# Patient Record
Sex: Female | Born: 1990 | Race: White | Hispanic: No | Marital: Married
Health system: Southern US, Community
[De-identification: ages and names within clinical notes are randomized; demographics above are authoritative.]

---

## 2010-12-19 ENCOUNTER — Other Ambulatory Visit (HOSPITAL_COMMUNITY): Payer: Self-pay | Admitting: Obstetrics and Gynecology

## 2010-12-19 DIAGNOSIS — Z3682 Encounter for antenatal screening for nuchal translucency: Secondary | ICD-10-CM

## 2010-12-20 ENCOUNTER — Ambulatory Visit (HOSPITAL_COMMUNITY): Payer: Self-pay

## 2010-12-20 ENCOUNTER — Other Ambulatory Visit (HOSPITAL_COMMUNITY): Payer: Self-pay

## 2010-12-21 ENCOUNTER — Ambulatory Visit (HOSPITAL_COMMUNITY)
Admission: RE | Admit: 2010-12-21 | Discharge: 2010-12-21 | Disposition: A | Discharge status: Home / Self Care | Payer: BC Managed Care – PPO | Source: Ambulatory Visit | Attending: Obstetrics and Gynecology | Admitting: Obstetrics and Gynecology

## 2010-12-21 DIAGNOSIS — O351XX0 Maternal care for (suspected) chromosomal abnormality in fetus, not applicable or unspecified: Secondary | ICD-10-CM | POA: Insufficient documentation

## 2010-12-21 DIAGNOSIS — Z3689 Encounter for other specified antenatal screening: Secondary | ICD-10-CM | POA: Insufficient documentation

## 2010-12-21 DIAGNOSIS — O3510X0 Maternal care for (suspected) chromosomal abnormality in fetus, unspecified, not applicable or unspecified: Secondary | ICD-10-CM | POA: Insufficient documentation

## 2010-12-21 DIAGNOSIS — Z3682 Encounter for antenatal screening for nuchal translucency: Secondary | ICD-10-CM

## 2010-12-27 ENCOUNTER — Inpatient Hospital Stay (HOSPITAL_COMMUNITY)
Admission: AD | Admit: 2010-12-27 | Discharge: 2010-12-27 | Disposition: A | Discharge status: Home / Self Care | Payer: BC Managed Care – PPO | Source: Ambulatory Visit | Attending: Obstetrics and Gynecology | Admitting: Obstetrics and Gynecology

## 2010-12-27 DIAGNOSIS — O2650 Maternal hypotension syndrome, unspecified trimester: Secondary | ICD-10-CM | POA: Insufficient documentation

## 2010-12-27 LAB — COMPREHENSIVE METABOLIC PANEL
ALT: 11 U/L (ref 0–35)
Alkaline Phosphatase: 52 U/L (ref 39–117)
BUN: 5 mg/dL — ABNORMAL LOW (ref 6–23)
CO2: 20 mEq/L (ref 19–32)
Chloride: 100 mEq/L (ref 96–112)
Glucose, Bld: 82 mg/dL (ref 70–99)
Potassium: 3.4 mEq/L — ABNORMAL LOW (ref 3.5–5.1)
Sodium: 128 mEq/L — ABNORMAL LOW (ref 135–145)
Total Bilirubin: 0.2 mg/dL — ABNORMAL LOW (ref 0.3–1.2)

## 2010-12-27 LAB — URINALYSIS, ROUTINE W REFLEX MICROSCOPIC
Bilirubin Urine: NEGATIVE
Hgb urine dipstick: NEGATIVE
Ketones, ur: NEGATIVE mg/dL
Protein, ur: NEGATIVE mg/dL
Urobilinogen, UA: 0.2 mg/dL (ref 0.0–1.0)

## 2010-12-27 LAB — CBC
HCT: 32.1 % — ABNORMAL LOW (ref 36.0–46.0)
Hemoglobin: 11 g/dL — ABNORMAL LOW (ref 12.0–15.0)
MCV: 79.1 fL (ref 78.0–100.0)
RBC: 4.06 MIL/uL (ref 3.87–5.11)
WBC: 9.6 10*3/uL (ref 4.0–10.5)

## 2010-12-27 LAB — URINE MICROSCOPIC-ADD ON

## 2010-12-28 LAB — GLUCOSE, CAPILLARY: Glucose-Capillary: 85 mg/dL (ref 70–99)

## 2011-12-16 IMAGING — US US OB NUCHAL TRANSLUCENCY 1ST GEST
1 series · 14 of 28 positions shown · non-contrast
Comparison: none

[Series 1: us ob nuchal translucency 1st gest · 0.21mm/px · 14 of 40 slices shown]
[im 2/40]
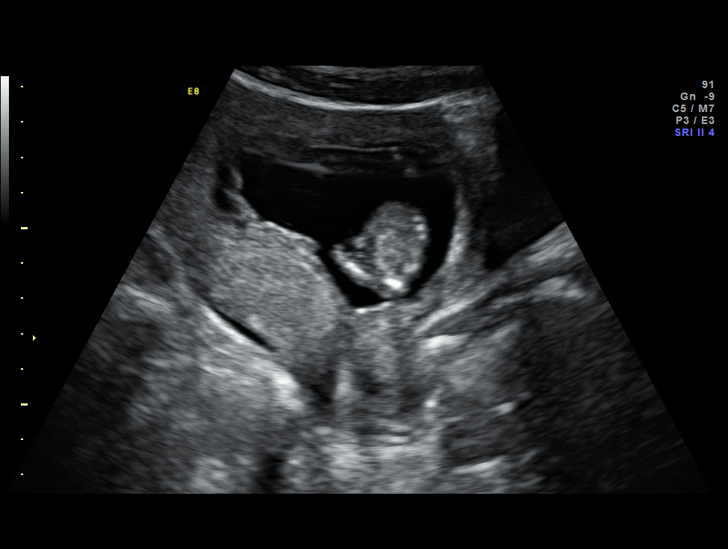
[im 5/40]
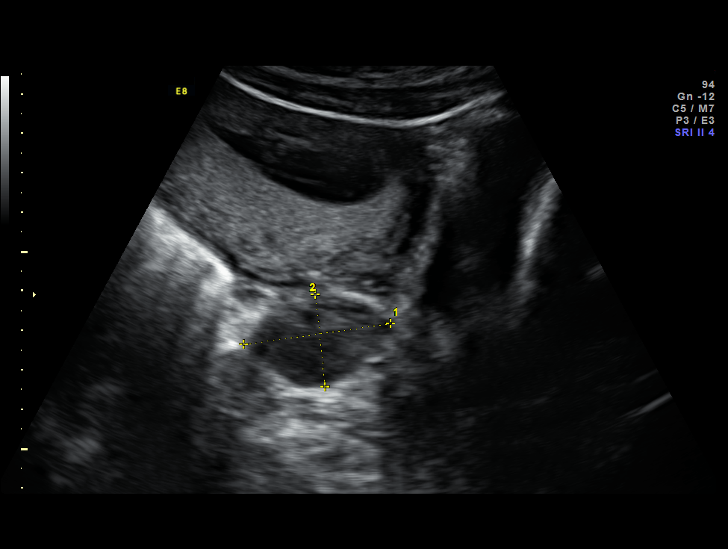
[im 8/40]
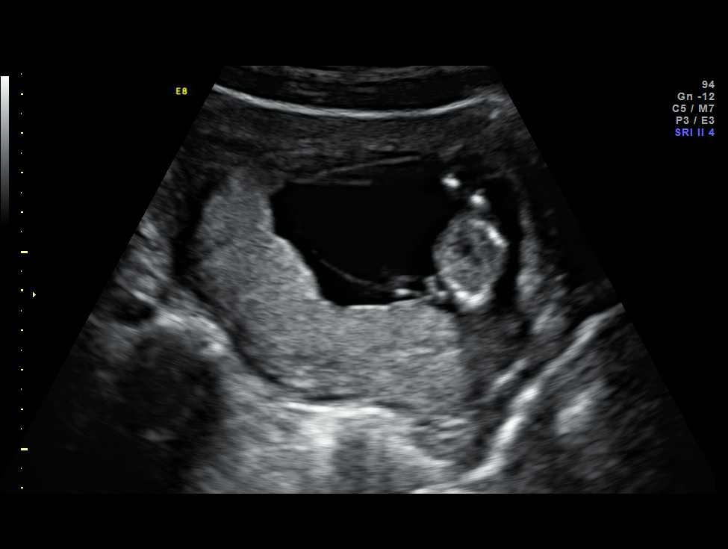
[im 11/40]
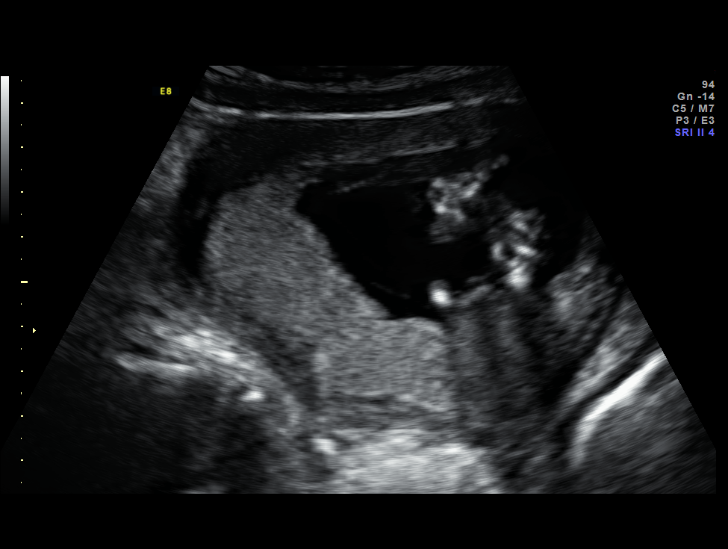
[im 14/40]
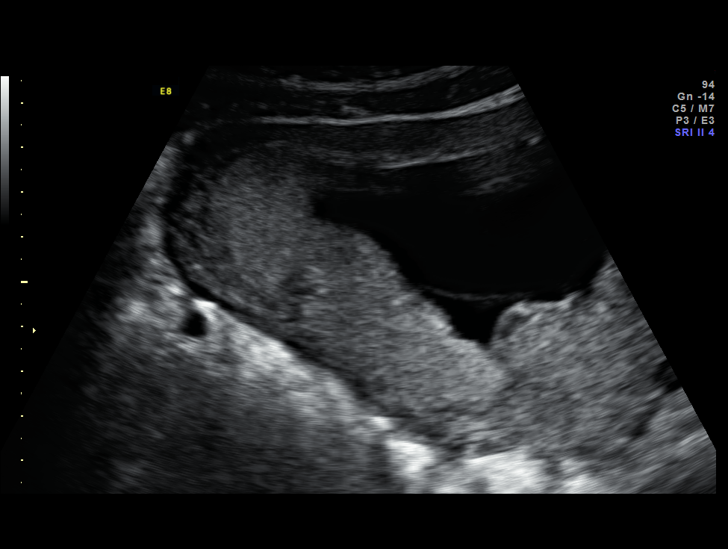
[im 16/40]
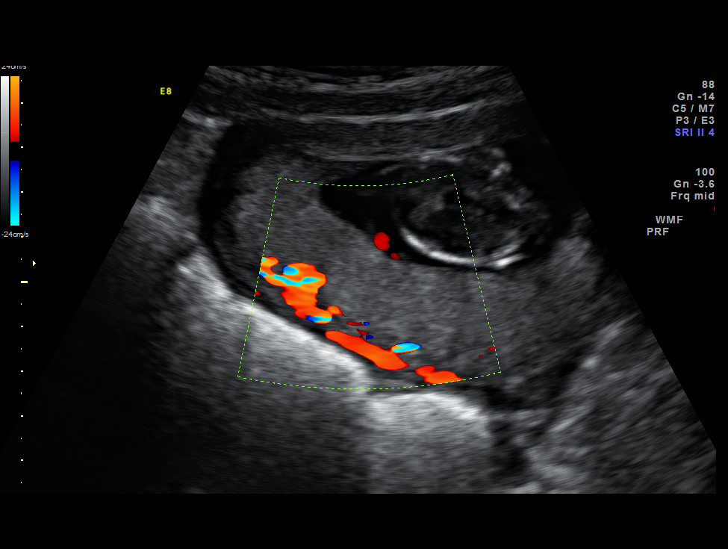
[im 19/40]
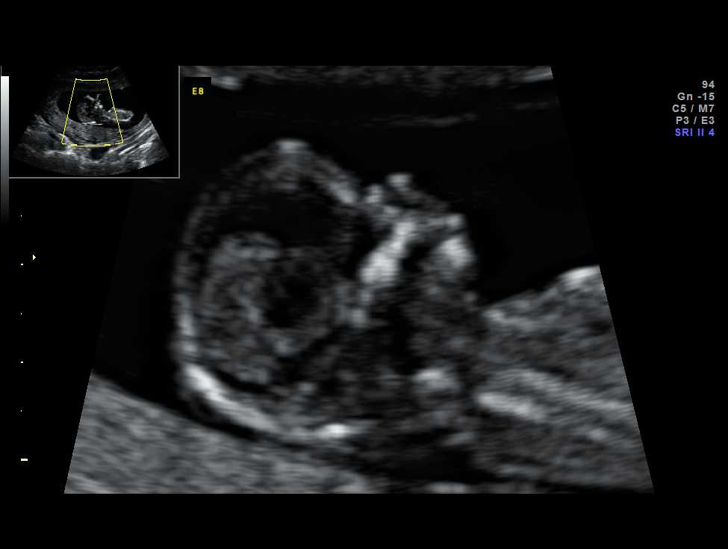
[im 22/40]
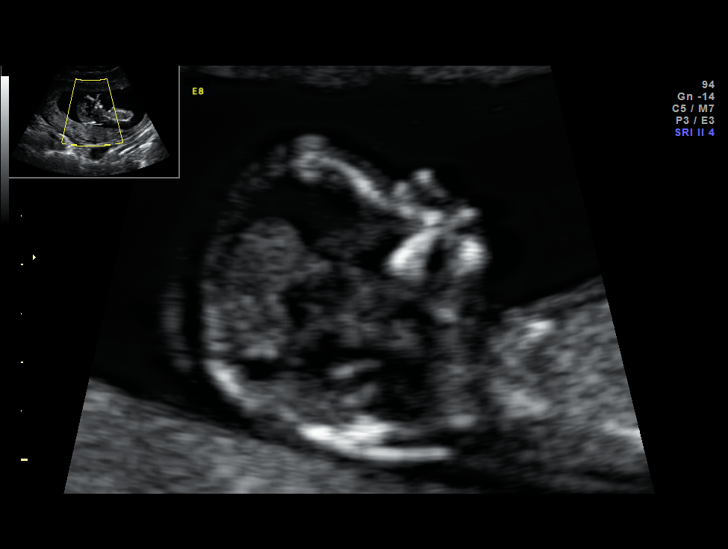
[im 25/40]
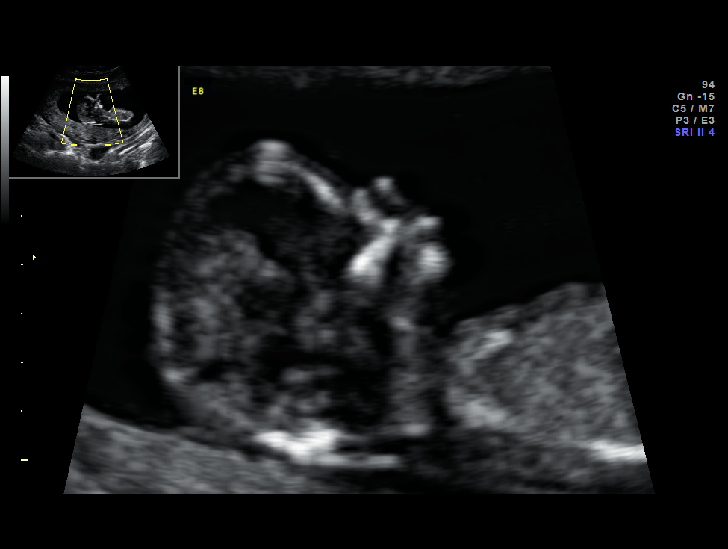
[im 28/40]
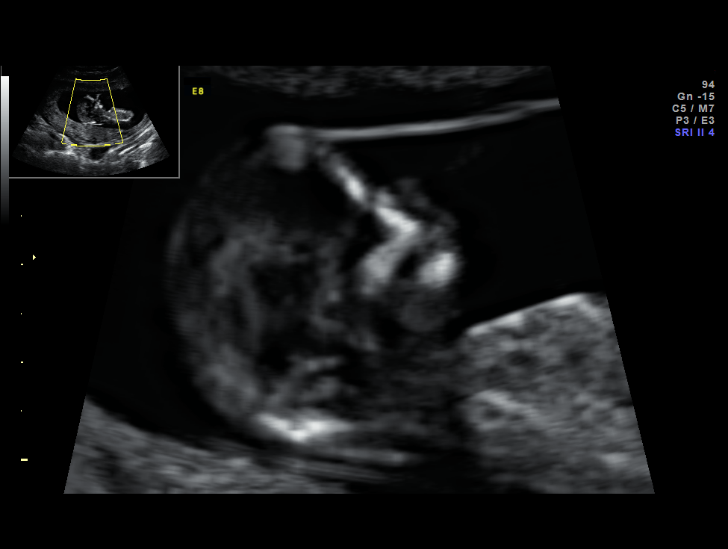
[im 31/40]
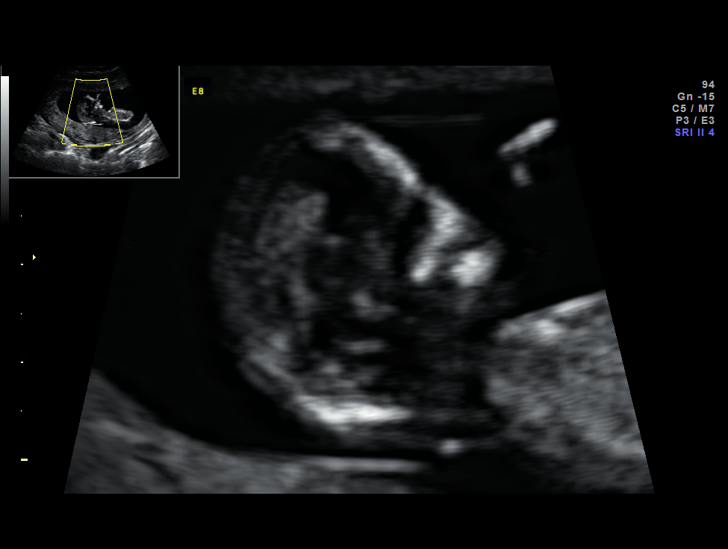
[im 34/40]
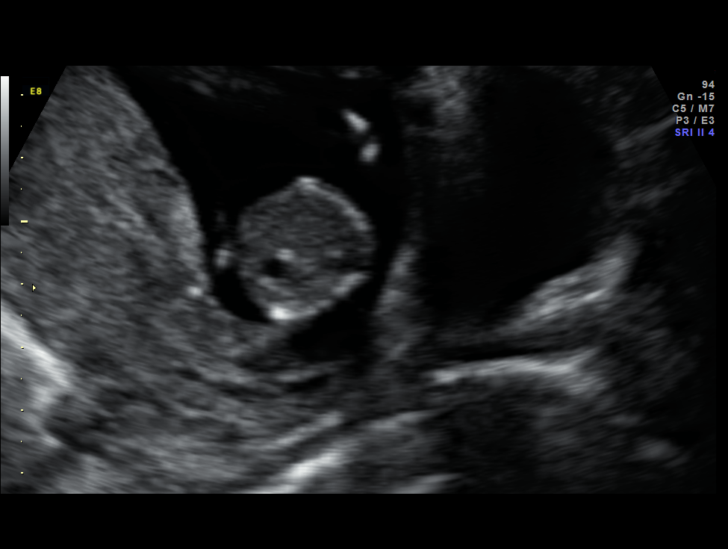
[im 37/40]
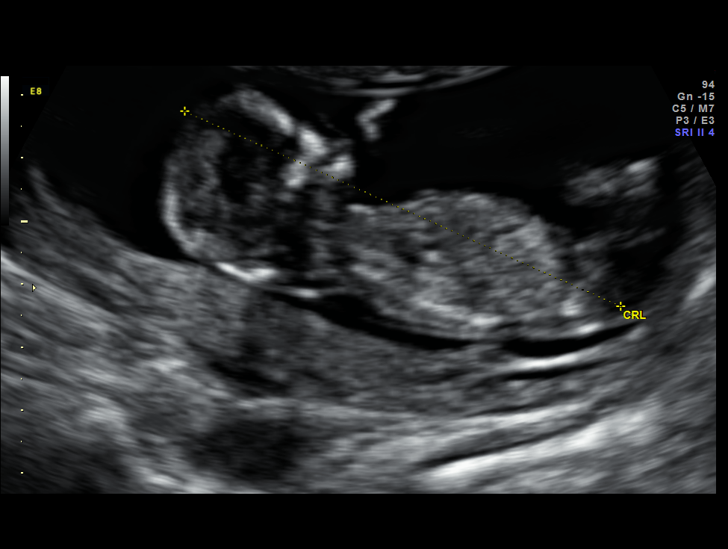
[im 40/40]
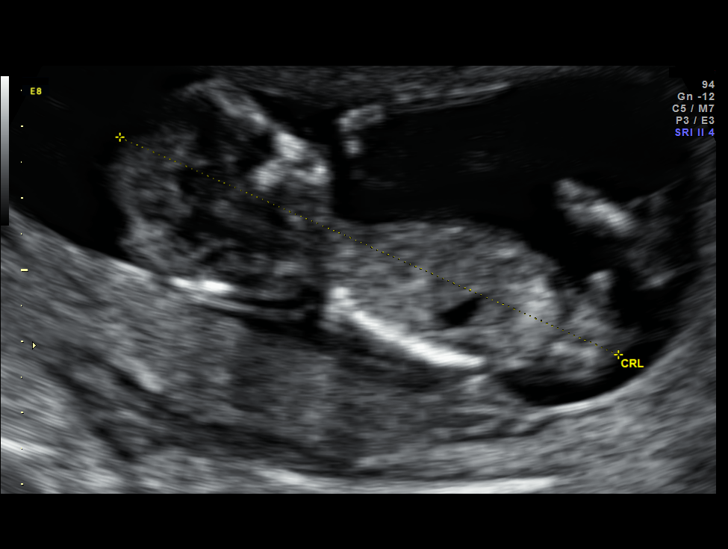

[14 of 28 positions shown; findings below may reference images not displayed]

Canned report from images found in remote index.

Refer to host system for actual result text.

## 2013-11-12 ENCOUNTER — Telehealth (HOSPITAL_COMMUNITY): Payer: Self-pay | Admitting: *Deleted

## 2013-11-16 NOTE — Telephone Encounter (Signed)
Error

## 2013-12-15 ENCOUNTER — Telehealth (HOSPITAL_COMMUNITY): Payer: Self-pay
